# Patient Record
Sex: Female | Born: 1980 | Race: Black or African American | Hispanic: No | Marital: Single | Smoking: Current every day smoker
Health system: Southern US, Community
[De-identification: ages and names within clinical notes are randomized; demographics above are authoritative.]

## PROBLEM LIST (undated history)

## (undated) HISTORY — PX: HERNIA REPAIR: SHX51

## (undated) HISTORY — PX: APPENDECTOMY: SHX54

---

## 2020-06-22 ENCOUNTER — Emergency Department (HOSPITAL_COMMUNITY)
Admission: EM | Admit: 2020-06-22 | Discharge: 2020-06-23 | Discharge status: Against Medical Advice | Payer: Medicaid Other | Attending: Emergency Medicine | Admitting: Emergency Medicine

## 2020-06-22 ENCOUNTER — Encounter (HOSPITAL_COMMUNITY): Payer: Self-pay | Admitting: *Deleted

## 2020-06-22 ENCOUNTER — Other Ambulatory Visit: Payer: Self-pay

## 2020-06-22 DIAGNOSIS — Z5321 Procedure and treatment not carried out due to patient leaving prior to being seen by health care provider: Secondary | ICD-10-CM | POA: Insufficient documentation

## 2020-06-22 DIAGNOSIS — Z886 Allergy status to analgesic agent status: Secondary | ICD-10-CM | POA: Diagnosis not present

## 2020-06-22 DIAGNOSIS — M545 Low back pain, unspecified: Secondary | ICD-10-CM | POA: Insufficient documentation

## 2020-06-22 DIAGNOSIS — R509 Fever, unspecified: Secondary | ICD-10-CM | POA: Insufficient documentation

## 2020-06-22 LAB — CBC WITH DIFFERENTIAL/PLATELET
Abs Immature Granulocytes: 0.03 10*3/uL (ref 0.00–0.07)
Basophils Absolute: 0 10*3/uL (ref 0.0–0.1)
Basophils Relative: 1 %
Eosinophils Absolute: 0.1 10*3/uL (ref 0.0–0.5)
Eosinophils Relative: 2 %
HCT: 37.5 % (ref 36.0–46.0)
Hemoglobin: 12.1 g/dL (ref 12.0–15.0)
Immature Granulocytes: 0 %
Lymphocytes Relative: 7 %
Lymphs Abs: 0.6 10*3/uL — ABNORMAL LOW (ref 0.7–4.0)
MCH: 28.1 pg (ref 26.0–34.0)
MCHC: 32.3 g/dL (ref 30.0–36.0)
MCV: 87 fL (ref 80.0–100.0)
Monocytes Absolute: 1 10*3/uL (ref 0.1–1.0)
Monocytes Relative: 11 %
Neutro Abs: 6.8 10*3/uL (ref 1.7–7.7)
Neutrophils Relative %: 79 %
Platelets: 287 10*3/uL (ref 150–400)
RBC: 4.31 MIL/uL (ref 3.87–5.11)
RDW: 14.6 % (ref 11.5–15.5)
WBC: 8.5 10*3/uL (ref 4.0–10.5)
nRBC: 0 % (ref 0.0–0.2)

## 2020-06-22 LAB — URINALYSIS, ROUTINE W REFLEX MICROSCOPIC
Bilirubin Urine: NEGATIVE
Glucose, UA: NEGATIVE mg/dL
Ketones, ur: NEGATIVE mg/dL
Leukocytes,Ua: NEGATIVE
Nitrite: NEGATIVE
Protein, ur: NEGATIVE mg/dL
Specific Gravity, Urine: 1.01 (ref 1.005–1.030)
pH: 7 (ref 5.0–8.0)

## 2020-06-22 LAB — BASIC METABOLIC PANEL
Anion gap: 9 (ref 5–15)
BUN: 7 mg/dL (ref 6–20)
CO2: 23 mmol/L (ref 22–32)
Calcium: 8.7 mg/dL — ABNORMAL LOW (ref 8.9–10.3)
Chloride: 105 mmol/L (ref 98–111)
Creatinine, Ser: 0.85 mg/dL (ref 0.44–1.00)
GFR, Estimated: >60 mL/min (ref >60)
Glucose, Bld: 89 mg/dL (ref 70–99)
Potassium: 4.5 mmol/L (ref 3.5–5.1)
Sodium: 137 mmol/L (ref 135–145)

## 2020-06-22 LAB — I-STAT BETA HCG BLOOD, ED (MC, WL, AP ONLY): I-stat hCG, quantitative: <5 m[IU]/mL (ref <5)

## 2020-06-22 MED ORDER — SODIUM CHLORIDE 0.9 % IV BOLUS: 1000.0000 mL | Freq: Once | INTRAVENOUS | Status: DC

## 2020-06-22 MED ORDER — ACETAMINOPHEN 325 MG PO TABS: 650.0000 mg | ORAL_TABLET | Freq: Once | ORAL | Status: DC

## 2020-06-22 MED ORDER — SODIUM CHLORIDE 0.9 % IV SOLN: 2.0000 g | Freq: Once | INTRAVENOUS | Status: DC

## 2020-06-22 NOTE — ED Triage Notes (Addendum)
Pt states she developed fever today with low back pain, urgency to urinate. Took Motrin at 1500 and is allergic to Tylenol.

## 2020-06-23 ENCOUNTER — Encounter (HOSPITAL_COMMUNITY): Payer: Self-pay

## 2020-06-23 ENCOUNTER — Emergency Department (HOSPITAL_COMMUNITY): Payer: Medicaid Other

## 2020-06-23 MED ORDER — SODIUM CHLORIDE (PF) 0.9 % IJ SOLN
INTRAMUSCULAR | Status: AC
  Filled 2020-06-23: qty 50

## 2020-06-23 MED ORDER — IOHEXOL 300 MG/ML  SOLN: 100.0000 mL | Freq: Once | INTRAMUSCULAR | Status: DC | PRN

## 2020-06-23 NOTE — ED Notes (Signed)
Pt not in lobby when called for CT

## 2020-06-26 ENCOUNTER — Emergency Department (HOSPITAL_COMMUNITY): Payer: Medicaid Other

## 2020-06-26 ENCOUNTER — Encounter (HOSPITAL_COMMUNITY): Payer: Self-pay | Admitting: Emergency Medicine

## 2020-06-26 ENCOUNTER — Emergency Department (HOSPITAL_COMMUNITY)
Admission: EM | Admit: 2020-06-26 | Discharge: 2020-06-26 | Disposition: A | Discharge status: Home / Self Care | Payer: Medicaid Other | Attending: Emergency Medicine | Admitting: Emergency Medicine

## 2020-06-26 DIAGNOSIS — R000 Tachycardia, unspecified: Secondary | ICD-10-CM | POA: Insufficient documentation

## 2020-06-26 DIAGNOSIS — N3001 Acute cystitis with hematuria: Secondary | ICD-10-CM | POA: Diagnosis not present

## 2020-06-26 DIAGNOSIS — U071 COVID-19: Secondary | ICD-10-CM | POA: Insufficient documentation

## 2020-06-26 DIAGNOSIS — F17200 Nicotine dependence, unspecified, uncomplicated: Secondary | ICD-10-CM | POA: Diagnosis not present

## 2020-06-26 DIAGNOSIS — R059 Cough, unspecified: Secondary | ICD-10-CM | POA: Diagnosis present

## 2020-06-26 LAB — CBC WITH DIFFERENTIAL/PLATELET
Abs Immature Granulocytes: 0 10*3/uL (ref 0.00–0.07)
Basophils Absolute: 0 10*3/uL (ref 0.0–0.1)
Basophils Relative: 1 %
Eosinophils Absolute: 0 10*3/uL (ref 0.0–0.5)
Eosinophils Relative: 0 %
HCT: 43.8 % (ref 36.0–46.0)
Hemoglobin: 13.9 g/dL (ref 12.0–15.0)
Immature Granulocytes: 0 %
Lymphocytes Relative: 55 %
Lymphs Abs: 2.2 10*3/uL (ref 0.7–4.0)
MCH: 26.7 pg (ref 26.0–34.0)
MCHC: 31.7 g/dL (ref 30.0–36.0)
MCV: 84.2 fL (ref 80.0–100.0)
Monocytes Absolute: 0.5 10*3/uL (ref 0.1–1.0)
Monocytes Relative: 12 %
Neutro Abs: 1.3 10*3/uL — ABNORMAL LOW (ref 1.7–7.7)
Neutrophils Relative %: 32 %
Platelets: 264 10*3/uL (ref 150–400)
RBC: 5.2 MIL/uL — ABNORMAL HIGH (ref 3.87–5.11)
RDW: 14.2 % (ref 11.5–15.5)
WBC: 3.9 10*3/uL — ABNORMAL LOW (ref 4.0–10.5)
nRBC: 0 % (ref 0.0–0.2)

## 2020-06-26 LAB — URINALYSIS, ROUTINE W REFLEX MICROSCOPIC
Glucose, UA: NEGATIVE mg/dL
Hgb urine dipstick: NEGATIVE
Ketones, ur: 20 mg/dL — AB
Leukocytes,Ua: NEGATIVE
Nitrite: NEGATIVE
Protein, ur: 100 mg/dL — AB
Specific Gravity, Urine: 1.032 — ABNORMAL HIGH (ref 1.005–1.030)
pH: 5 (ref 5.0–8.0)

## 2020-06-26 LAB — BASIC METABOLIC PANEL
Anion gap: 15 (ref 5–15)
BUN: 8 mg/dL (ref 6–20)
CO2: 22 mmol/L (ref 22–32)
Calcium: 9.2 mg/dL (ref 8.9–10.3)
Chloride: 102 mmol/L (ref 98–111)
Creatinine, Ser: 0.96 mg/dL (ref 0.44–1.00)
GFR, Estimated: >60 mL/min (ref >60)
Glucose, Bld: 112 mg/dL — ABNORMAL HIGH (ref 70–99)
Potassium: 3.1 mmol/L — ABNORMAL LOW (ref 3.5–5.1)
Sodium: 139 mmol/L (ref 135–145)

## 2020-06-26 LAB — D-DIMER, QUANTITATIVE: D-Dimer, Quant: 0.83 ug/mL-FEU — ABNORMAL HIGH (ref 0.00–0.50)

## 2020-06-26 MED ORDER — ONDANSETRON 4 MG PO TBDP: ORAL_TABLET | ORAL | 0 refills | Status: AC

## 2020-06-26 MED ORDER — SODIUM CHLORIDE 0.9 % IV BOLUS
1000.0000 mL | Freq: Once | INTRAVENOUS | Status: AC
  Administered 2020-06-26: 1000 mL via INTRAVENOUS

## 2020-06-26 MED ORDER — IOHEXOL 350 MG/ML SOLN
75.0000 mL | Freq: Once | INTRAVENOUS | Status: AC | PRN
  Administered 2020-06-26: 75 mL via INTRAVENOUS

## 2020-06-26 NOTE — ED Notes (Signed)
Pt HR Increase to 130 while at rest MD is made aware

## 2020-06-26 NOTE — ED Provider Notes (Signed)
[facility] EMERGENCY DEPARTMENT Provider Note   CSN: [id number] Arrival date & time: 06/26/20  1221     History Chief Complaint  Patient presents with  . Covid Positive    Amber Galvan is a 39 y.o. female.  Patient was diagnosed with UTI and COVID at urgent care on Sunday. She has been taking her antibiotics as directed. She is continuing to experience cough, body aches, shortness of breath with exertion, nausea and vomiting. She denies abdominal pain.  The history is provided by the patient. No language interpreter was used.  Shortness of Breath Severity:  Mild Onset quality:  Gradual Duration:  3 days Timing:  Intermittent Progression:  Worsening Chronicity:  New Context: activity   Associated symptoms: cough, fever and vomiting   Associated symptoms: no abdominal pain and no chest pain   Risk factors: no hx of PE/DVT        History reviewed. No pertinent past medical history.  There are no problems to display for this patient.   Past Surgical History:  Procedure Laterality Date  . APPENDECTOMY    . CESAREAN SECTION    . HERNIA REPAIR       OB History   No obstetric history on file.     No family history on file.  Social History   Tobacco Use  . Smoking status: Current Every Day Smoker  . Smokeless tobacco: Never Used  Substance Use Topics  . Alcohol use: Yes  . Drug use: Yes    Home Medications Prior to Admission medications   Not on File    Allergies    Tylenol [acetaminophen]  Review of Systems   Review of Systems  Constitutional: Positive for fever.  Respiratory: Positive for cough and shortness of breath.   Cardiovascular: Negative for chest pain.  Gastrointestinal: Positive for vomiting. Negative for abdominal pain.  All other systems reviewed and are negative.   Physical Exam Updated Vital Signs BP 118/87   Pulse (!) 102   Temp 98.6 F (37 C) (Oral)   Resp 16   SpO2 100%   Physical  Exam Constitutional:      Appearance: Normal appearance.  HENT:     Mouth/Throat:     Mouth: Mucous membranes are moist.  Eyes:     Conjunctiva/sclera: Conjunctivae normal.  Cardiovascular:     Rate and Rhythm: Tachycardia present.  Pulmonary:     Effort: Pulmonary effort is normal.     Breath sounds: Normal breath sounds.  Abdominal:     General: Abdomen is flat.     Palpations: Abdomen is soft.  Musculoskeletal:        General: Normal range of motion.     Cervical back: Neck supple.  Skin:    General: Skin is warm and dry.  Neurological:     Mental Status: She is alert and oriented to person, place, and time.  Psychiatric:        Mood and Affect: Mood normal.        Behavior: Behavior normal.     ED Results / Procedures / Treatments   Labs (all labs ordered are listed, but only abnormal results are displayed) Labs Reviewed  URINALYSIS, ROUTINE W REFLEX MICROSCOPIC  CBC WITH DIFFERENTIAL/PLATELET  BASIC METABOLIC PANEL  D-DIMER, QUANTITATIVE (NOT AT [facility])    EKG None  Radiology No results found.  Procedures Procedures (including critical care time)  Medications Ordered in ED Medications - No data to display  ED Course  I  have reviewed the triage vital signs and the nursing notes.  Pertinent labs & imaging results that were available during my care of the patient were reviewed by me and considered in my medical decision making (see chart for details).  Patient is not orthostatic, but has significant increase in tachycardia and feeling of shortness of breath with position change. Basic labs and d-dimer ordered.    MDM Rules/Calculators/A&P                          Patient improved after IV fluids.  Pt diagnosed with a UTI. Pt is afebrile, without current tachycardia, hypotension, or other signs of serious infection.  Pt to continue with antibiotics and instructed to follow up with PCP if symptoms persist. Discussed return precautions. Pt appears safe for  discharge.  Amber Galvan was evaluated in Emergency Department on 06/26/2020 for the symptoms described in the history of present illness. She was evaluated in the context of the global COVID-19 pandemic, which necessitated consideration that the patient might be at risk for infection with the SARS-CoV-2 virus that causes COVID-19. Institutional protocols and algorithms that pertain to the evaluation of patients at risk for COVID-19 are in a state of rapid change based on information released by regulatory bodies including the CDC and federal and state organizations. These policies and algorithms were followed during the patient's care in the ED. Final Clinical Impression(s) / ED Diagnoses Final diagnoses:  COVID-19  Acute cystitis with hematuria    Rx / DC Orders ED Discharge Orders    None       Felicie Morn, NP 06/26/20 2226    Melene Plan, DO 06/29/20 1503

## 2020-06-26 NOTE — ED Triage Notes (Signed)
Pt arrives via [facility] from home, diagnosed with covid on Sunday at [facility]. Also diagnosed with UTI as well which she was given an abx for. C/o sob, body aches, cough. 100% on room air, resp e/u, nad.

## 2020-06-26 NOTE — ED Notes (Signed)
Pt d/c by MD and is provided w/ d/c instructions and follow up care, Pt out of the ED ambulatory  

## 2020-06-26 NOTE — Discharge Instructions (Signed)
Please continue with your antibiotic for your UTI. Maintain hydration. Ibuprofen or tylenol for body aches. Follow-up with the COVID clinic and/or your primary care provider.

## 2022-02-03 IMAGING — CT CT ANGIO CHEST
2 of 6 series · 19 of 46 positions shown · IV contrast (omnipaque)
Comparison: Chest x-ray 06/26/2020

CLINICAL DATA: Shortness of breath, elevated D-dimer. Suspected
pulmonary embolus.

EXAM:
CT ANGIOGRAPHY CHEST WITH CONTRAST
TECHNIQUE: Multidetector CT imaging of the chest was performed using the
standard protocol during bolus administration of intravenous
contrast. Multiplanar CT image reconstructions and MIPs were
obtained to evaluate the vascular anatomy.
CONTRAST:  75mL OMNIPAQUE IOHEXOL 350 MG/ML SOLN

[Series 6: thins · axial · 0.79mm/px · z∈[+1221,+1467]mm · 16 of 270 slices shown]
[im 12/270  lung]
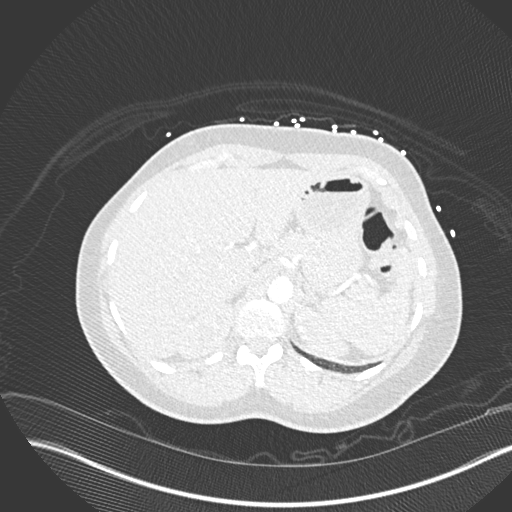
[im 36/270  soft-tissue]
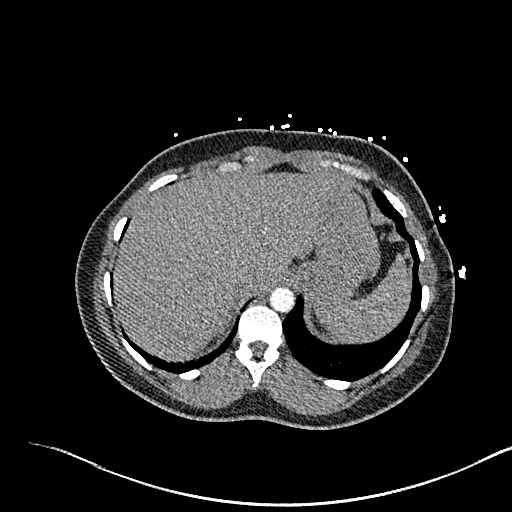
[im 47/270  lung]
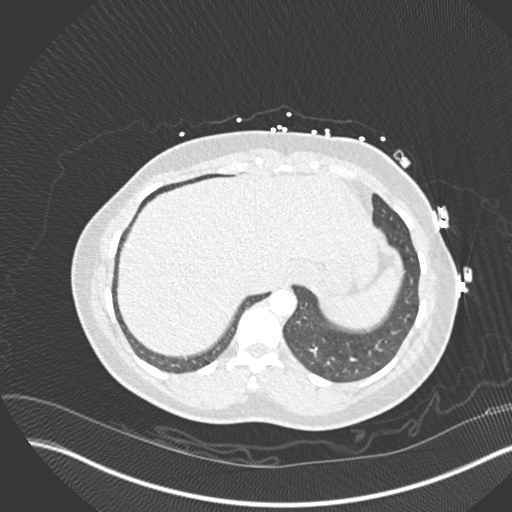
[im 59/270  soft-tissue]
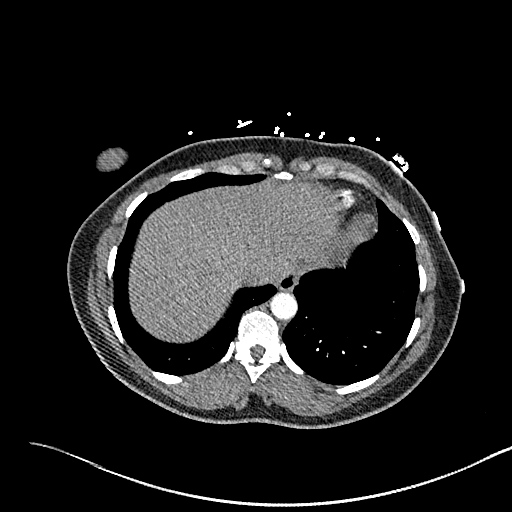
[im 82/270  lung]
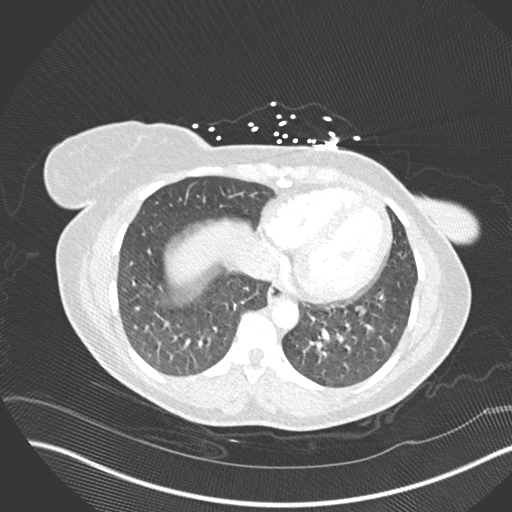
[im 94/270  soft-tissue]
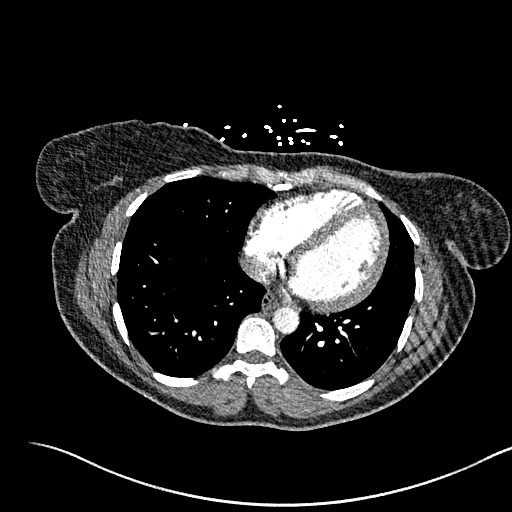
[im 106/270  lung]
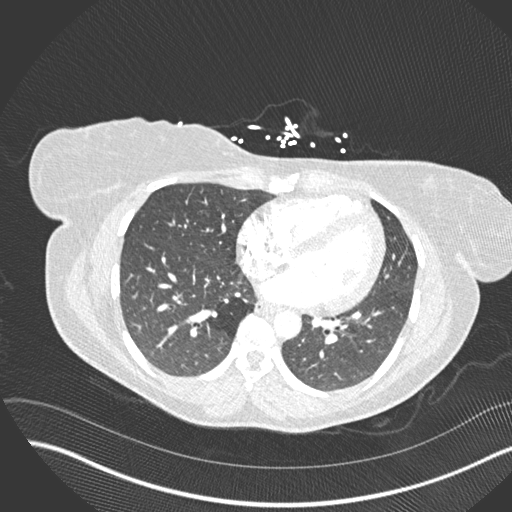
[im 129/270  soft-tissue]
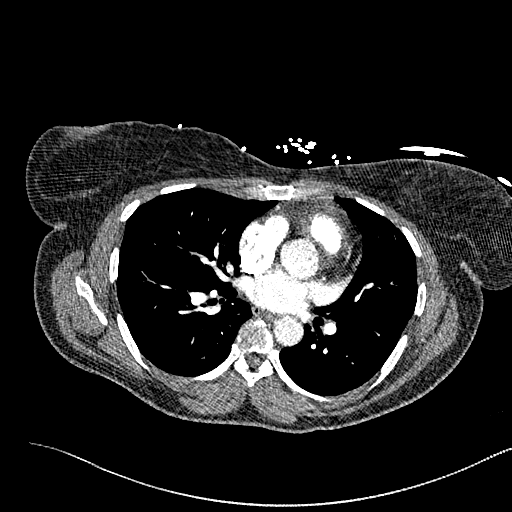
[im 141/270  lung]
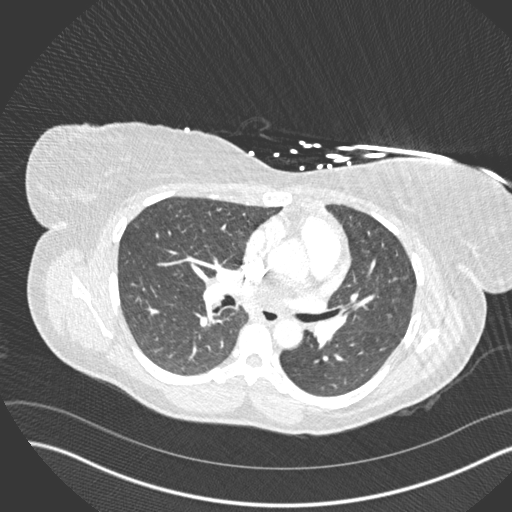
[im 164/270  soft-tissue]
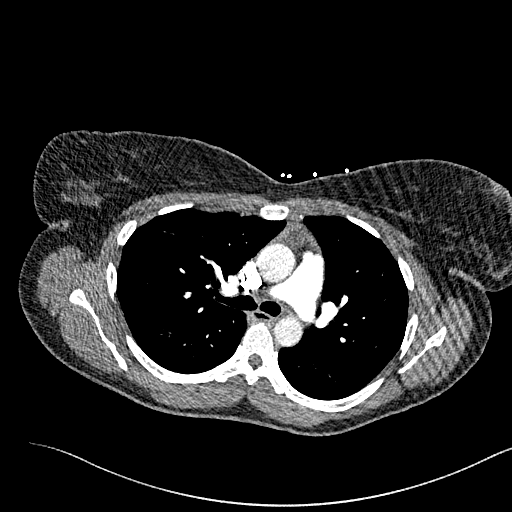
[im 176/270  lung]
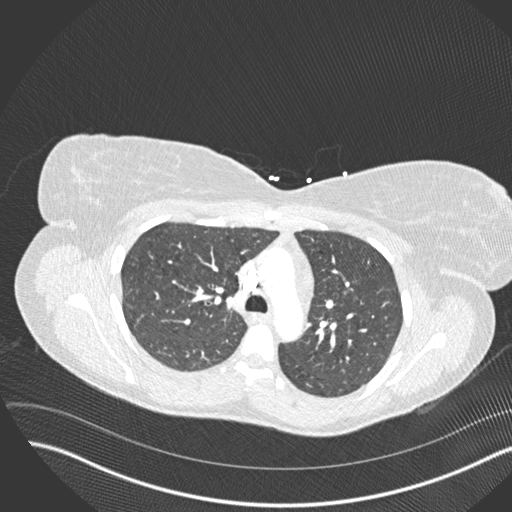
[im 188/270  soft-tissue]
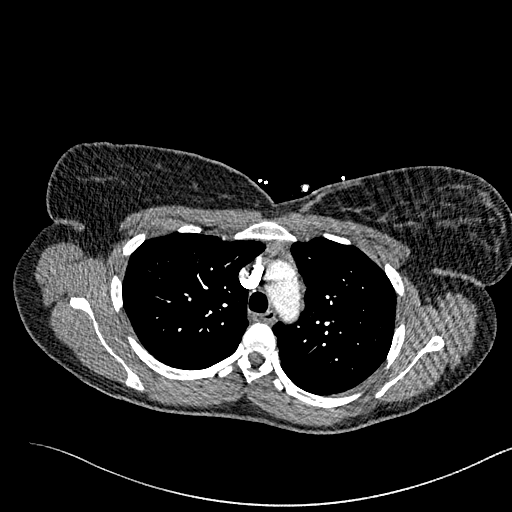
[im 211/270  lung]
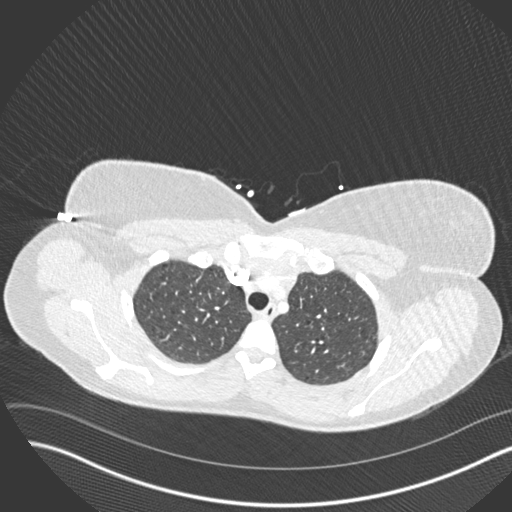
[im 223/270  soft-tissue]
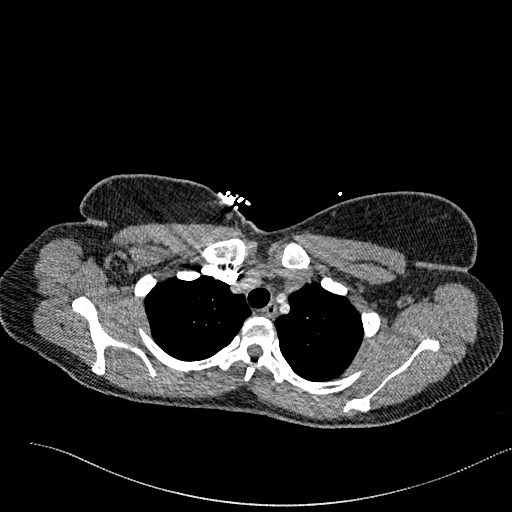
[im 234/270  lung]
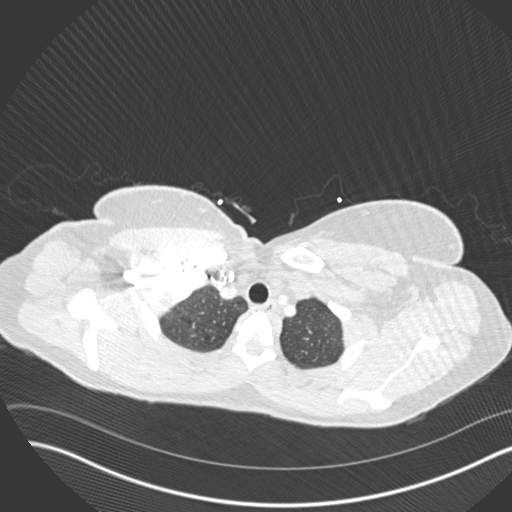
[im 258/270  soft-tissue]
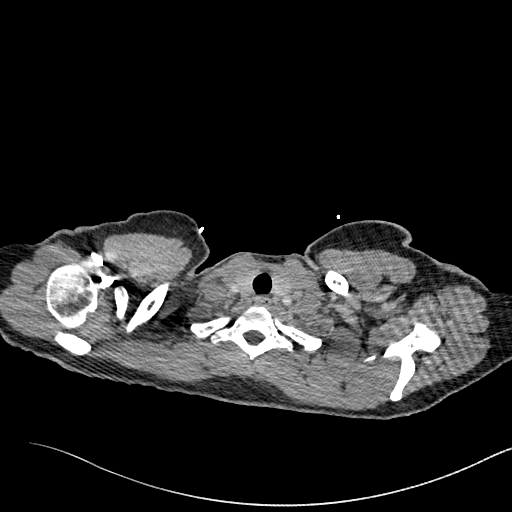

[Series 8: coronal mpr · coronal · 0.59mm/px · 3 of 151 slices shown]
[im 38/151  soft-tissue]
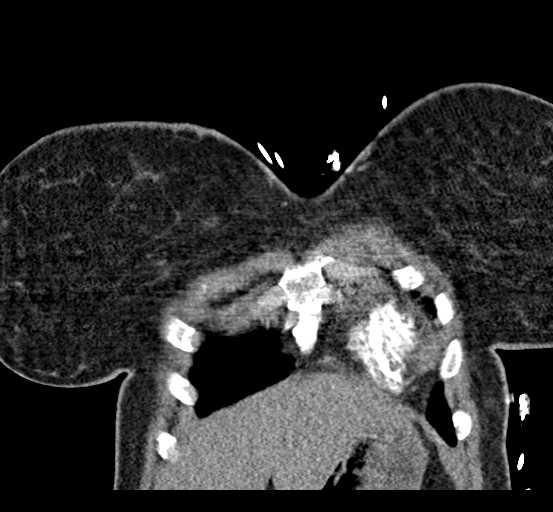
[im 76/151  soft-tissue]
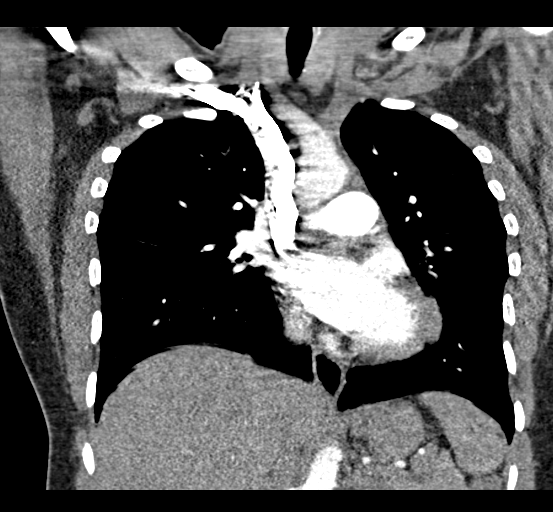
[im 113/151  soft-tissue]
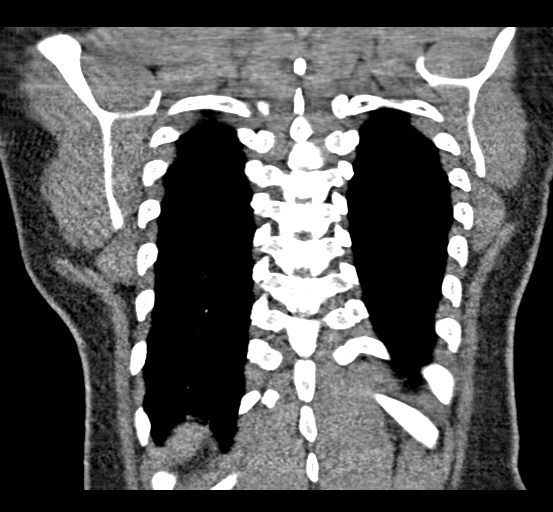

[19 of 46 positions shown; findings below may reference images not displayed]

FINDINGS: Cardiovascular: Satisfactory opacification of the pulmonary arteries
to the segmental level. No evidence of pulmonary embolism. The main
pulmonary artery is normal in caliber. Normal heart size. No
pericardial effusion. The thoracic aorta is normal in caliber.

Mediastinum/Nodes: No enlarged mediastinal, hilar, or axillary lymph
nodes. Thyroid gland, trachea, and esophagus demonstrate no
significant findings.

Lungs/Pleura: Lungs are clear. No pleural effusion or pneumothorax.

Upper Abdomen: No acute abnormality.

Musculoskeletal: No chest wall abnormality. No acute or significant
osseous findings.

Review of the MIP images confirms the above findings.
IMPRESSION: 1. No pulmonary embolus.
2. No intrathoracic abnormality
